# Patient Record
Sex: Male | Born: 2005 | Race: White | Hispanic: No | Marital: Single | Smoking: Never smoker
Health system: Southern US, Community
[De-identification: ages and names within clinical notes are randomized; demographics above are authoritative.]

---
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
## 2023-08-25 ENCOUNTER — Other Ambulatory Visit: Payer: Self-pay

## 2023-08-25 ENCOUNTER — Emergency Department: Payer: Medicaid Other

## 2023-08-25 ENCOUNTER — Encounter: Payer: Self-pay | Admitting: Emergency Medicine

## 2023-08-25 ENCOUNTER — Ambulatory Visit
Admission: EM | Admit: 2023-08-25 | Discharge: 2023-08-25 | Disposition: A | Discharge status: Home / Self Care | Payer: Medicaid Other | Attending: Surgery | Admitting: Surgery

## 2023-08-25 ENCOUNTER — Other Ambulatory Visit: Payer: Self-pay | Admitting: Surgery

## 2023-08-25 ENCOUNTER — Encounter
Admission: EM | Disposition: A | Discharge status: Home / Self Care | Payer: Self-pay | Source: Home / Self Care | Attending: Emergency Medicine

## 2023-08-25 DIAGNOSIS — K353 Acute appendicitis with localized peritonitis, without perforation or gangrene: Secondary | ICD-10-CM | POA: Diagnosis present

## 2023-08-25 DIAGNOSIS — K3580 Unspecified acute appendicitis: Secondary | ICD-10-CM | POA: Diagnosis not present

## 2023-08-25 HISTORY — PX: XI ROBOTIC LAPAROSCOPIC ASSISTED APPENDECTOMY: SHX6877

## 2023-08-25 LAB — COMPREHENSIVE METABOLIC PANEL
ALT: 27 U/L (ref 0–44)
AST: 19 U/L (ref 15–41)
Albumin: 4.5 g/dL (ref 3.5–5.0)
Alkaline Phosphatase: 67 U/L (ref 38–126)
Anion gap: 9 (ref 5–15)
BUN: 16 mg/dL (ref 6–20)
CO2: 23 mmol/L (ref 22–32)
Calcium: 9.1 mg/dL (ref 8.9–10.3)
Chloride: 104 mmol/L (ref 98–111)
Creatinine, Ser: 0.76 mg/dL (ref 0.61–1.24)
GFR, Estimated: >60 mL/min (ref >60)
Glucose, Bld: 97 mg/dL (ref 70–99)
Potassium: 3.6 mmol/L (ref 3.5–5.1)
Sodium: 136 mmol/L (ref 135–145)
Total Bilirubin: 1.4 mg/dL — ABNORMAL HIGH (ref 0.3–1.2)
Total Protein: 7.9 g/dL (ref 6.5–8.1)

## 2023-08-25 LAB — CBC WITH DIFFERENTIAL/PLATELET
Abs Immature Granulocytes: 0.04 10*3/uL (ref 0.00–0.07)
Basophils Absolute: 0.1 10*3/uL (ref 0.0–0.1)
Basophils Relative: 1 %
Eosinophils Absolute: 0.1 10*3/uL (ref 0.0–0.5)
Eosinophils Relative: 1 %
HCT: 40.5 % (ref 39.0–52.0)
Hemoglobin: 14 g/dL (ref 13.0–17.0)
Immature Granulocytes: 1 %
Lymphocytes Relative: 26 %
Lymphs Abs: 2.2 10*3/uL (ref 0.7–4.0)
MCH: 30 pg (ref 26.0–34.0)
MCHC: 34.6 g/dL (ref 30.0–36.0)
MCV: 86.9 fL (ref 80.0–100.0)
Monocytes Absolute: 0.7 10*3/uL (ref 0.1–1.0)
Monocytes Relative: 8 %
Neutro Abs: 5.4 10*3/uL (ref 1.7–7.7)
Neutrophils Relative %: 63 %
Platelets: 276 10*3/uL (ref 150–400)
RBC: 4.66 MIL/uL (ref 4.22–5.81)
RDW: 12.8 % (ref 11.5–15.5)
WBC: 8.4 10*3/uL (ref 4.0–10.5)
nRBC: 0 % (ref 0.0–0.2)

## 2023-08-25 LAB — LIPASE, BLOOD: Lipase: 27 U/L (ref 11–51)

## 2023-08-25 SURGERY — APPENDECTOMY, ROBOT-ASSISTED, LAPAROSCOPIC
Anesthesia: General

## 2023-08-25 MED ORDER — LACTATED RINGERS IV SOLN: INTRAVENOUS | Status: DC | PRN

## 2023-08-25 MED ORDER — PIPERACILLIN-TAZOBACTAM 3.375 G IVPB
INTRAVENOUS | Status: AC
  Filled 2023-08-25: qty 50

## 2023-08-25 MED ORDER — SUGAMMADEX SODIUM 200 MG/2ML IV SOLN
INTRAVENOUS | Status: DC | PRN
  Administered 2023-08-25: 300 mg via INTRAVENOUS

## 2023-08-25 MED ORDER — ONDANSETRON HCL 4 MG/2ML IJ SOLN
INTRAMUSCULAR | Status: DC | PRN
Start: 2023-08-25 — End: 2023-08-25
  Administered 2023-08-25: 4 mg via INTRAVENOUS

## 2023-08-25 MED ORDER — FENTANYL CITRATE (PF) 100 MCG/2ML IJ SOLN
INTRAMUSCULAR | Status: AC
  Filled 2023-08-25: qty 2

## 2023-08-25 MED ORDER — PROPOFOL 10 MG/ML IV BOLUS
INTRAVENOUS | Status: AC
  Filled 2023-08-25: qty 40

## 2023-08-25 MED ORDER — SODIUM CHLORIDE 0.9 % IV SOLN: INTRAVENOUS | Status: DC

## 2023-08-25 MED ORDER — ACETAMINOPHEN 10 MG/ML IV SOLN
INTRAVENOUS | Status: DC | PRN
  Administered 2023-08-25: 1000 mg via INTRAVENOUS

## 2023-08-25 MED ORDER — ONDANSETRON HCL 4 MG/2ML IJ SOLN: 4.0000 mg | Freq: Four times a day (QID) | INTRAMUSCULAR | Status: DC | PRN

## 2023-08-25 MED ORDER — FENTANYL CITRATE (PF) 100 MCG/2ML IJ SOLN
INTRAMUSCULAR | Status: DC | PRN
  Administered 2023-08-25: 50 ug via INTRAVENOUS

## 2023-08-25 MED ORDER — ROCURONIUM BROMIDE 100 MG/10ML IV SOLN
INTRAVENOUS | Status: DC | PRN
  Administered 2023-08-25: 50 mg via INTRAVENOUS
  Administered 2023-08-25: 20 mg via INTRAVENOUS

## 2023-08-25 MED ORDER — OXYCODONE HCL 5 MG PO TABS
ORAL_TABLET | ORAL | Status: AC
  Filled 2023-08-25: qty 1

## 2023-08-25 MED ORDER — IOHEXOL 350 MG/ML SOLN
100.0000 mL | Freq: Once | INTRAVENOUS | Status: AC | PRN
  Administered 2023-08-25: 100 mL via INTRAVENOUS

## 2023-08-25 MED ORDER — BUPIVACAINE-EPINEPHRINE (PF) 0.25% -1:200000 IJ SOLN
INTRAMUSCULAR | Status: DC | PRN
  Administered 2023-08-25: 10 mL
  Administered 2023-08-25: 20 mL

## 2023-08-25 MED ORDER — DEXMEDETOMIDINE HCL IN NACL 80 MCG/20ML IV SOLN
INTRAVENOUS | Status: DC | PRN
Start: 2023-08-25 — End: 2023-08-25
  Administered 2023-08-25: 20 ug via INTRAVENOUS

## 2023-08-25 MED ORDER — SODIUM CHLORIDE 0.9 % IV SOLN: Freq: Once | INTRAVENOUS | Status: AC

## 2023-08-25 MED ORDER — MIDAZOLAM HCL 2 MG/2ML IJ SOLN
INTRAMUSCULAR | Status: AC
  Filled 2023-08-25: qty 2

## 2023-08-25 MED ORDER — PIPERACILLIN-TAZOBACTAM 3.375 G IVPB 30 MIN
3.3750 g | Freq: Once | INTRAVENOUS | Status: AC
  Administered 2023-08-25: 3.375 g via INTRAVENOUS
  Filled 2023-08-25: qty 50

## 2023-08-25 MED ORDER — DEXAMETHASONE SODIUM PHOSPHATE 10 MG/ML IJ SOLN
INTRAMUSCULAR | Status: DC | PRN
  Administered 2023-08-25: 10 mg via INTRAVENOUS

## 2023-08-25 MED ORDER — PROPOFOL 10 MG/ML IV BOLUS
INTRAVENOUS | Status: DC | PRN
Start: 2023-08-25 — End: 2023-08-25
  Administered 2023-08-25: 50 ug/kg/min via INTRAVENOUS
  Administered 2023-08-25: 250 mg via INTRAVENOUS
  Administered 2023-08-25: 50 mg via INTRAVENOUS

## 2023-08-25 MED ORDER — IBUPROFEN 800 MG PO TABS: 800.0000 mg | ORAL_TABLET | Freq: Three times a day (TID) | ORAL | 0 refills | Status: AC | PRN

## 2023-08-25 MED ORDER — HYDROCODONE-ACETAMINOPHEN 5-325 MG PO TABS: 1.0000 | ORAL_TABLET | Freq: Four times a day (QID) | ORAL | 0 refills | Status: DC | PRN

## 2023-08-25 MED ORDER — FENTANYL CITRATE (PF) 100 MCG/2ML IJ SOLN: 25.0000 ug | INTRAMUSCULAR | Status: DC | PRN

## 2023-08-25 MED ORDER — MORPHINE SULFATE (PF) 4 MG/ML IV SOLN: 4.0000 mg | INTRAVENOUS | Status: DC | PRN

## 2023-08-25 MED ORDER — PIPERACILLIN-TAZOBACTAM 3.375 G IVPB 30 MIN
3.3750 g | Freq: Once | INTRAVENOUS | Status: AC
  Administered 2023-08-25: 3.375 g via INTRAVENOUS

## 2023-08-25 MED ORDER — GLYCOPYRROLATE 0.2 MG/ML IJ SOLN
INTRAMUSCULAR | Status: DC | PRN
  Administered 2023-08-25: .2 mg via INTRAVENOUS

## 2023-08-25 MED ORDER — LIDOCAINE HCL (CARDIAC) PF 100 MG/5ML IV SOSY
PREFILLED_SYRINGE | INTRAVENOUS | Status: DC | PRN
  Administered 2023-08-25: 100 mg via INTRAVENOUS

## 2023-08-25 MED ORDER — MIDAZOLAM HCL 2 MG/2ML IJ SOLN
INTRAMUSCULAR | Status: DC | PRN
  Administered 2023-08-25: 2 mg via INTRAVENOUS

## 2023-08-25 MED ORDER — DROPERIDOL 2.5 MG/ML IJ SOLN: 0.6250 mg | Freq: Once | INTRAMUSCULAR | Status: DC | PRN

## 2023-08-25 MED ORDER — ACETAMINOPHEN 10 MG/ML IV SOLN
INTRAVENOUS | Status: AC
  Filled 2023-08-25: qty 100

## 2023-08-25 MED ORDER — LIDOCAINE HCL (PF) 2 % IJ SOLN
INTRAMUSCULAR | Status: AC
  Filled 2023-08-25: qty 5

## 2023-08-25 MED ORDER — HYDROCODONE-ACETAMINOPHEN 5-300 MG PO TABS: 1.0000 | ORAL_TABLET | Freq: Four times a day (QID) | ORAL | 0 refills | Status: DC | PRN

## 2023-08-25 MED ORDER — BUPIVACAINE-EPINEPHRINE (PF) 0.25% -1:200000 IJ SOLN
INTRAMUSCULAR | Status: AC
  Filled 2023-08-25: qty 30

## 2023-08-25 MED ORDER — OXYCODONE HCL 5 MG PO TABS
5.0000 mg | ORAL_TABLET | Freq: Once | ORAL | Status: AC
  Administered 2023-08-25: 5 mg via ORAL

## 2023-08-25 SURGICAL SUPPLY — 57 items
ADH SKN CLS APL DERMABOND .7 (GAUZE/BANDAGES/DRESSINGS) ×1
BAG PRESSURE INF REUSE 3000 (BAG) IMPLANT
BLADE CLIPPER SURG (BLADE) IMPLANT
CANNULA REDUCER 12-8 DVNC XI (CANNULA) IMPLANT
COVER TIP SHEARS 8 DVNC (MISCELLANEOUS) ×1 IMPLANT
CUTTER FLEX LINEAR 45M (STAPLE) IMPLANT
DERMABOND ADVANCED .7 DNX12 (GAUZE/BANDAGES/DRESSINGS) ×1 IMPLANT
DRAPE ARM DVNC X/XI (DISPOSABLE) ×3 IMPLANT
DRAPE COLUMN DVNC XI (DISPOSABLE) ×1 IMPLANT
FORCEPS BPLR R/ABLATION 8 DVNC (INSTRUMENTS) ×1 IMPLANT
GLOVE ORTHO TXT STRL SZ7.5 (GLOVE) ×2 IMPLANT
GOWN STRL REUS W/ TWL LRG LVL3 (GOWN DISPOSABLE) ×1 IMPLANT
GOWN STRL REUS W/ TWL XL LVL3 (GOWN DISPOSABLE) ×2 IMPLANT
GOWN STRL REUS W/TWL LRG LVL3 (GOWN DISPOSABLE) ×1
GOWN STRL REUS W/TWL XL LVL3 (GOWN DISPOSABLE) ×1
GRASPER SUT TROCAR 14GX15 (MISCELLANEOUS) IMPLANT
GRASPER TIP-UP FEN DVNC XI (INSTRUMENTS) ×1 IMPLANT
IRRIGATION STRYKERFLOW (MISCELLANEOUS) IMPLANT
IRRIGATOR STRYKERFLOW (MISCELLANEOUS)
IRRIGATOR SUCT 8 DISP DVNC XI (IRRIGATION / IRRIGATOR) IMPLANT
IV NS IRRIG 3000ML ARTHROMATIC (IV SOLUTION) IMPLANT
KIT PINK PAD W/HEAD ARE REST (MISCELLANEOUS) ×1 IMPLANT
KIT PINK PAD W/HEAD ARM REST (MISCELLANEOUS) ×1 IMPLANT
KIT TURNOVER KIT A (KITS) ×1 IMPLANT
LABEL OR SOLS (LABEL) ×1 IMPLANT
MANIFOLD NEPTUNE II (INSTRUMENTS) ×1 IMPLANT
NDL DRIVE SUT CUT DVNC (INSTRUMENTS) ×1 IMPLANT
NDL HYPO 22X1.5 SAFETY MO (MISCELLANEOUS) ×1 IMPLANT
NDL INSUFFLATION 14GA 120MM (NEEDLE) IMPLANT
NEEDLE DRIVE SUT CUT DVNC (INSTRUMENTS) ×1 IMPLANT
NEEDLE HYPO 22X1.5 SAFETY MO (MISCELLANEOUS) ×1 IMPLANT
NEEDLE INSUFFLATION 14GA 120MM (NEEDLE) ×1 IMPLANT
NS IRRIG 500ML POUR BTL (IV SOLUTION) ×1 IMPLANT
PACK LAP CHOLECYSTECTOMY (MISCELLANEOUS) ×1 IMPLANT
RELOAD 45 VASCULAR/THIN (ENDOMECHANICALS) IMPLANT
RELOAD STAPLE 45 2.5 WHT DVNC (STAPLE) IMPLANT
RELOAD STAPLE 45 2.5 WHT GRN (ENDOMECHANICALS) IMPLANT
RELOAD STAPLER 2.5X45 WHT DVNC (STAPLE) IMPLANT
SCISSORS MNPLR CVD DVNC XI (INSTRUMENTS) ×1 IMPLANT
SEAL UNIV 5-12 XI (MISCELLANEOUS) ×3 IMPLANT
SET TUBE SMOKE EVAC HIGH FLOW (TUBING) ×1 IMPLANT
SPIKE FLUID TRANSFER (MISCELLANEOUS) ×1 IMPLANT
STAPLER 45 SUREFORM DVNC (STAPLE) IMPLANT
STAPLER RELOAD 2.5X45 WHT DVNC (STAPLE)
SUT MNCRL 4-0 (SUTURE) ×1
SUT MNCRL 4-0 27XMFL (SUTURE) ×1
SUT SILK 2 0 SH (SUTURE) IMPLANT
SUT VIC AB 2-0 SH 27 (SUTURE) ×1
SUT VIC AB 2-0 SH 27XBRD (SUTURE) ×1 IMPLANT
SUT VICRYL 0 UR6 27IN ABS (SUTURE) ×1 IMPLANT
SUTURE MNCRL 4-0 27XMF (SUTURE) ×1 IMPLANT
SYS BAG RETRIEVAL 10MM (BASKET) ×1
SYSTEM BAG RETRIEVAL 10MM (BASKET) ×1 IMPLANT
TRAP FLUID SMOKE EVACUATOR (MISCELLANEOUS) ×1 IMPLANT
TRAY FOLEY SLVR 16FR LF STAT (SET/KITS/TRAYS/PACK) ×1 IMPLANT
TROCAR Z-THREAD FIOS 12X100MM (TROCAR) ×1 IMPLANT
WATER STERILE IRR 500ML POUR (IV SOLUTION) ×1 IMPLANT

## 2023-08-25 NOTE — ED Notes (Signed)
Patient transported to CT 

## 2023-08-25 NOTE — Op Note (Signed)
Robotic appendectomy  Pre-operative Diagnosis: Acute appendicitis  Post-operative Diagnosis: same.    Surgeon: [name], M.D., FACS  Anesthesia: GETA   Findings: Exudative changes with omental adherence.  No gangrene, no perforation.   Estimated Blood Loss: 5 mL         Specimens:  Appendix           Complications: none              Procedure Details  The patient was seen again in the Holding Room. The benefits, complications, treatment options, and expected outcomes were discussed with the patient. The risks of bleeding, infection, recurrence of symptoms, failure to resolve symptoms, unanticipated injury, prosthetic placement, prosthetic infection, any of which could require further surgery were reviewed with the patient. The likelihood of improving the patient's symptoms with return to their baseline status is anticipated.  The patient and/or family concurred with the proposed plan, giving informed consent.  The patient was taken to Operating Room, identified and the procedure verified.    Prior to the induction of general anesthesia, antibiotic prophylaxis was administered. VTE prophylaxis was in place. GETA was then administered and tolerated well. After the induction, the patient was positioned in the supine position and the abdomen was prepped with Chloraprep and draped in the sterile fashion.  A Time Out was held and the above information confirmed.  After local infiltration of quarter percent Marcaine with epinephrine,just below the costal margin approximately midclavicular line the Veress needle is passed with sensation of the layers to penetrate the abdominal wall and into the peritoneum.  Saline drop test is confirmed peritoneal placement.  Insufflation is initiated with carbon dioxide to pressures of 15 mmHg.  With local anesthetic infiltration, a left lower quadrant incision is made, and an optical 12 mm trocar is passed into the peritoneal cavity under direct  visualization.  Additional 8.5 mm robotic trochars placed in the left abdominal wall under direct visualization.  Local infiltration with 0.25% Marcaine with epinephrine is utilized for all port sites with deep infiltration under visualization.   Using a force bipolar graspers with monopolar scissors I proceeded with dissecting out the soft tissues adjacent to the cecum and appendix to fully identify the appendix, and mobilize it to the cecal junction.  The mesoappendix was carefully divided utilizing bipolar cautery, monopolar cautery and scissors. With the appendiceal cecal junction fully isolated, the appendiceal base was crimped, a double ligature of 2-0 silk was utilized to ligate the base of the appendix, and the appendix was divided with scissors, the appendiceal mucosa was then fulgurated with the same. I utilized a 2-0 Silk to dunk the appendiceal stump into the cecal serosa, burying the stump with a pursestring of suture. We then placed the appendix in a retrieval bag and withdrew it out the largest port site. We then undocked the robot and proceeded with completing the procedure laparoscopically. We closed the largest port site utilizing PMI and cone with a 0 Vicryl under direct visualization.  The abdomen was then desufflated and the trochars removed. Incisions were then irrigated and closed with subcuticulars of 4-0 Monocryl.  Skin sealed with Dermabond.  Patient tolerated procedure well.    [name] M.D., [facility] 08/25/2023 3:03 PM

## 2023-08-25 NOTE — H&P (Signed)
[facility] SURGICAL HISTORY & PHYSICAL (cpt [phone])  HISTORY OF PRESENT ILLNESS (HPI):  17 y.o. male presented to [facility] ED overnight for abdominal pain. Patient reports RLQ abdominal pain over the course of the last 24 hours or so. This has remained fairly constant with slight worsening. No associated fever, chills, nausea, emesis, or bowel changes. No previous abdominal surgeries. No history of similar in the past. Work up in the ED revealed normal WBC at 8.4K, sCr 0.476, no electrolyte derangements. CT Abdomen/Pelvis was obtained and concerning for acute uncomplicated appendicitis.   General Surgery is consulted by emergency medicine physician Dr Loleta Rose, MD for evaluation and management of acute appendicitis   PAST MEDICAL HISTORY (PMH):  History reviewed. No pertinent past medical history.  Reviewed. Otherwise negative.   PAST SURGICAL HISTORY (PSH):  History reviewed. No pertinent surgical history.  Reviewed. Otherwise negative.   MEDICATIONS:  Prior to Admission medications   Not on File     ALLERGIES:  No Known Allergies   SOCIAL HISTORY:  Social History   Socioeconomic History   Marital status: Single    Spouse name: Not on file   Number of children: Not on file   Years of education: Not on file   Highest education level: Not on file  Occupational History   Not on file  Tobacco Use   Smoking status: Never    Passive exposure: Never   Smokeless tobacco: Never  Vaping Use   Vaping status: Never Used  Substance and Sexual Activity   Alcohol use: Never   Drug use: Never   Sexual activity: Never  Other Topics Concern   Not on file  Social History Narrative   Not on file   Social Determinants of Health   Financial Resource Strain: Not on file  Food Insecurity: Not on file  Transportation Needs: Not on file  Physical Activity: Not on file  Stress: Not on file  Social Connections: Not on file  Intimate Partner Violence: Not on file      FAMILY HISTORY:  History reviewed. No pertinent family history.  Otherwise negative.   REVIEW OF SYSTEMS:  Review of Systems  Constitutional:  Negative for chills and fever.  Respiratory:  Negative for cough and shortness of breath.   Cardiovascular:  Negative for chest pain and palpitations.  Gastrointestinal:  Positive for abdominal pain. Negative for constipation, diarrhea, nausea and vomiting.  Genitourinary:  Negative for dysuria and urgency.  All other systems reviewed and are negative.   VITAL SIGNS:  Temp:  [98.5 F (36.9 C)] 98.5 F (36.9 C) (08/28 0551) Pulse Rate:  [67] 67 (08/28 0551) Resp:  [17] 17 (08/28 0551) BP: (130)/(54) 130/54 (08/28 0551) SpO2:  [99 %] 99 % (08/28 0551) Weight:  [253 kg] 108 kg (08/28 0552)     Height: 5\' 9"  (175.3 cm) Weight: 108 kg BMI (Calculated): 35.13   PHYSICAL EXAM:  Physical Exam Vitals and nursing note reviewed. Exam conducted with a chaperone present.  Constitutional:      General: He is not in acute distress.    Appearance: He is well-developed. He is not ill-appearing.  HENT:     Head: Normocephalic and atraumatic.  Cardiovascular:     Rate and Rhythm: Normal rate and regular rhythm.     Heart sounds: Normal heart sounds.  Pulmonary:     Effort: Pulmonary effort is normal. No respiratory distress.  Abdominal:     General: Abdomen is flat. There is no distension.  Palpations: Abdomen is soft.     Tenderness: There is abdominal tenderness in the right lower quadrant. There is no guarding or rebound. Positive signs include McBurney's sign.     Comments: Abdomen is soft, localized RLQ tenderness, non-distended, no rebound/guarding   Genitourinary:    Comments: Deferred Skin:    General: Skin is warm and dry.     Coloration: Skin is not jaundiced.     Findings: No erythema.  Neurological:     General: No focal deficit present.     Mental Status: He is alert and oriented to person, place, and time.  Psychiatric:         Mood and Affect: Mood normal.        Behavior: Behavior normal.     INTAKE/OUTPUT:  This shift: No intake/output data recorded.  Last 2 shifts: @IOLAST2SHIFTS @  Labs:     Latest Ref Rng & Units 08/25/2023    6:03 AM 03/27/2022    8:59 PM  CBC  WBC 4.0 - 10.5 K/uL 8.4  8.5   Hemoglobin 13.0 - 17.0 g/dL 16.1  09.6   Hematocrit 39.0 - 52.0 % 40.5  41.3   Platelets 150 - 400 K/uL 276  287       Latest Ref Rng & Units 08/25/2023    6:03 AM 03/27/2022    8:59 PM  CMP  Glucose 70 - 99 mg/dL 97  99   BUN 6 - 20 mg/dL 16  12   Creatinine 0.45 - 1.24 mg/dL 4.09  8.11   Sodium 914 - 145 mmol/L 136  139   Potassium 3.5 - 5.1 mmol/L 3.6  3.7   Chloride 98 - 111 mmol/L 104  106   CO2 22 - 32 mmol/L 23  26   Calcium 8.9 - 10.3 mg/dL 9.1  9.4   Total Protein 6.5 - 8.1 g/dL 7.9  7.5   Total Bilirubin 0.3 - 1.2 mg/dL 1.4  0.9   Alkaline Phos 38 - 126 U/L 67  77   AST 15 - 41 U/L 19  36   ALT 0 - 44 U/L 27  43      Imaging studies:   CT Abdomen/Pelvis (08/25/2023) personally reviewed inflammation around distal appendix, no free air, and radiologist report reviewed below:  IMPRESSION: 1. Evidence of Acute Appendicitis, especially at the tip. Trace free fluid in the pelvis which is likely reactive. No evidence of perforation or abscess.   2. Otherwise negative CT Abdomen and Pelvis.   Assessment/Plan: (ICD-10's: K35.30) 17 y.o. male with acute uncomplicated appendicitis      - Plan for robotic assisted laparoscopic appendectomy with Dr Claudine Mouton pending OR/Anesthesia availability  - All risks, benefits, and alternatives to above procedure(s) were discussed with the patient and his family (mother at bedside), all of his questions were answered to his expressed satisfaction, patient expresses he wishes to proceed, and informed consent was obtained.    - NPO + IVF support  - IV Abx (Zosyn)  - Monitor abdominal examination  - Pain control prn; antiemetics prn   - Discharge  Planning: Plan for DC home post-operatively pending timing/clinical condition/operative findings, updated on instructions and follow up.  All of the above findings and recommendations were discussed with the patient and his family (mother at bedside), and all of their questions were answered to their expressed satisfaction.  -- Lynden Oxford, PA-C [facility] 08/25/2023, 7:32 AM M-F: 7am - 4pm

## 2023-08-25 NOTE — ED Provider Notes (Signed)
[facility] Provider Note    Event Date/Time   First MD Initiated Contact with Patient 08/25/23 [phone]     (approximate)   History   Abdominal Pain   HPI Bruce Carson is a 17 y.o. male with no chronic medical issues who presents for evaluation of right lower quadrant pain for a few days.  He said it has been about the same but maybe is getting a little bit worse.  No history of trauma.  A little bit worse when he moves around.  He denies fever, nausea, vomiting, diarrhea, any other abdominal pain, dysuria, pain in his scrotum.     Physical Exam   Triage Vital Signs: ED Triage Vitals  Encounter Vitals Group     BP 08/25/23 0551 (!) 130/54     Systolic BP Percentile --      Diastolic BP Percentile --      Pulse Rate 08/25/23 0551 67     Resp 08/25/23 0551 17     Temp 08/25/23 0551 98.5 F (36.9 C)     Temp src --      SpO2 08/25/23 0551 99 %     Weight 08/25/23 0552 108 kg (238 lb)     Height 08/25/23 0552 1.753 m (5\' 9" )     Head Circumference --      Peak Flow --      Pain Score 08/25/23 0552 8     Pain Loc --      Pain Education --      Exclude from Growth Chart --     Most recent vital signs: Vitals:   08/25/23 0551  BP: (!) 130/54  Pulse: 67  Resp: 17  Temp: 98.5 F (36.9 C)  SpO2: 99%    General: Awake, no distress.  CV:  Good peripheral perfusion.  Regular rate and rhythm Resp:  Normal effort. Speaking easily and comfortably, no accessory muscle usage nor intercostal retractions.   Abd:  No distention.  Tender to palpation of the right lower quadrant with some guarding.  No rebound.  No other tenderness to palpation.   ED Results / Procedures / Treatments   Labs (all labs ordered are listed, but only abnormal results are displayed) Labs Reviewed  COMPREHENSIVE METABOLIC PANEL - Abnormal; Notable for the following components:      Result Value   Total Bilirubin 1.4 (*)    All other components within normal limits  CBC WITH  DIFFERENTIAL/PLATELET  LIPASE, BLOOD      RADIOLOGY I viewed and interpreted the patient's CT of the abdomen and pelvis.  I did not immediately see   PROCEDURES:  Critical Care performed: No  Procedures    IMPRESSION / MDM / ASSESSMENT AND PLAN / ED COURSE  I reviewed the triage vital signs and the nursing notes.                              Differential diagnosis includes, but is not limited to, appendicitis, epiploic appendagitis, mesenteric adenitis, musculoskeletal strain, less likely biliary colic or testicular torsion.  Patient's presentation is most consistent with acute presentation with potential threat to life or bodily function.  Labs/studies ordered: CT abdomen/pelvis, CBC, lipase, CMP  Interventions/Medications given:  Medications  piperacillin-tazobactam (ZOSYN) IVPB 3.375 g (has no administration in time range)  morphine (PF) 4 MG/ML injection 4 mg (has no administration in time range)  ondansetron (ZOFRAN) injection 4 mg (has  no administration in time range)  0.9 %  sodium chloride infusion (has no administration in time range)  iohexol (OMNIPAQUE) 350 MG/ML injection 100 mL (100 mLs Intravenous Contrast Given 08/25/23 0616)    (Note:  hospital course my include additional interventions and/or labs/studies not listed above.)   Patient has relatively minimal pain and tenderness that has been going on for a few days but he is guarding with possibly some localized peritonitis in the right lower quadrant.  He is the appropriate demographic for appendicitis.  Vital signs are all stable and within normal limits.  Labs pending but no concern about his renal function.  We will proceed with CT abdomen/pelvis to evaluate for and hopefully rule out appendicitis.  Patient is not in need of analgesia nor antiemetics at this time.   Clinical Course as of 08/25/23 0743  Wed Aug 25, 2023  0102 Lab work is all within normal limits other than a very minimally elevated  total bilirubin which is unlikely to be clinically significant [CF]  0651 CT ABDOMEN PELVIS W CONTRAST I viewed and interpreted the patient's CT abdomen/pelvis.  I did not immediately identify an acute abnormality, but the radiologist feels that the appendiceal tip is infected and consistent with acute uncomplicated appendicitis.  I will consult surgery for further evaluation/management. [CF]  [id number] I consulted Dr. Claudine Mouton with general surgery.  He will admit the patient.  He requested I order antibiotics ordered Zosyn 3.375 g IV and previously reminded the patient to remain NPO.  He has been n.p.o. since 10 PM. [CF]    Clinical Course User Index [CF] Loleta Rose, MD     FINAL CLINICAL IMPRESSION(S) / ED DIAGNOSES   Final diagnoses:  Acute appendicitis with localized peritonitis, without perforation, abscess, or gangrene     Rx / DC Orders   ED Discharge Orders     None        Note:  This document was prepared using Dragon voice recognition software and may include unintentional dictation errors.   Loleta Rose, MD 08/25/23 [phone]

## 2023-08-25 NOTE — Discharge Instructions (Addendum)
In addition to included general post-operative instructions,  Diet: Resume home diet.   Activity: No heavy lifting >20 pounds (children, pets, laundry, garbage) or strenuous activity for 4 weeks, but light activity and walking are encouraged. Do not drive or drink alcohol if taking narcotic pain medications or having pain that might distract from driving.  Wound care: 2 days after surgery (08/30), you may shower/get incision wet with soapy water and pat dry (do not rub incisions), but no baths or submerging incision underwater until follow-up.   Medications: Resume all home medications. For mild to moderate pain: acetaminophen (Tylenol) or ibuprofen/naproxen (if no kidney disease). Combining Tylenol with alcohol can substantially increase your risk of causing liver disease. Narcotic pain medications, if prescribed, can be used for severe pain, though may cause nausea, constipation, and drowsiness. Do not combine Tylenol and Percocet (or similar) within a 6 hour period as Percocet (and similar) contain(s) Tylenol. If you do not need the narcotic pain medication, you do not need to fill the prescription.  Call office [phone] / [phone]) at any time if any questions, worsening pain, fevers/chills, bleeding, drainage from incision site, or other concerns.    AMBULATORY SURGERY  DISCHARGE INSTRUCTIONS   The drugs that you were given will stay in your system until tomorrow so for the next 24 hours you should not:  Drive an automobile Make any legal decisions Drink any alcoholic beverage   You may resume regular meals tomorrow.  Today it is better to start with liquids and gradually work up to solid foods.  You may eat anything you prefer, but it is better to start with liquids, then soup and crackers, and gradually work up to solid foods.   Please notify your doctor immediately if you have any unusual bleeding, trouble breathing, redness and pain at the surgery site, drainage,  fever, or pain not relieved by medication.    Additional Instructions:        Please contact your physician with any problems or Same Day Surgery at [phone], Monday through Friday 6 am to 4 pm, or  at [facility] number at [phone].

## 2023-08-25 NOTE — Anesthesia Procedure Notes (Signed)
Procedure Name: Intubation Date/Time: 08/25/2023 1:43 PM  Performed by: Lysbeth Penner, CRNAPre-anesthesia Checklist: Patient identified, Emergency Drugs available, Suction available and Patient being monitored Patient Re-evaluated:Patient Re-evaluated prior to induction Oxygen Delivery Method: Circle system utilized Preoxygenation: Pre-oxygenation with 100% oxygen Induction Type: IV induction Ventilation: Mask ventilation without difficulty Laryngoscope Size: McGraph and 4 Tube type: Oral Tube size: 7.0 mm Number of attempts: 1 Airway Equipment and Method: Stylet and Oral airway Placement Confirmation: ETT inserted through vocal cords under direct vision, positive ETCO2 and breath sounds checked- equal and bilateral Secured at: 24 cm Tube secured with: Tape Dental Injury: Teeth and Oropharynx as per pre-operative assessment

## 2023-08-25 NOTE — Anesthesia Preprocedure Evaluation (Signed)
Anesthesia Evaluation  Patient identified by MRN, date of birth, ID band Patient awake    Reviewed: Allergy & Precautions, H&P , NPO status , Patient's Chart, lab work & pertinent test results, reviewed documented beta blocker date and time   History of Anesthesia Complications Negative for: history of anesthetic complications  Airway Mallampati: III  TM Distance: >3 FB Neck ROM: full    Dental  (+) Dental Advidsory Given, Teeth Intact   Pulmonary neg pulmonary ROS, Continuous Positive Airway Pressure Ventilation    Pulmonary exam normal breath sounds clear to auscultation       Cardiovascular Exercise Tolerance: Good negative cardio ROS Normal cardiovascular exam Rhythm:regular Rate:Normal     Neuro/Psych negative neurological ROS  negative psych ROS   GI/Hepatic negative GI ROS, Neg liver ROS,,,  Endo/Other  negative endocrine ROS    Renal/GU negative Renal ROS  negative genitourinary   Musculoskeletal   Abdominal   Peds  Hematology negative hematology ROS (+)   Anesthesia Other Findings History reviewed. No pertinent past medical history.   Reproductive/Obstetrics negative OB ROS                             Anesthesia Physical Anesthesia Plan  ASA: 1  Anesthesia Plan: General   Post-op Pain Management:    Induction: Intravenous  PONV Risk Score and Plan: 2 and Ondansetron, Dexamethasone, Midazolam and Treatment may vary due to age or medical condition  Airway Management Planned: Oral ETT  Additional Equipment:   Intra-op Plan:   Post-operative Plan: Extubation in OR  Informed Consent: I have reviewed the patients History and Physical, chart, labs and discussed the procedure including the risks, benefits and alternatives for the proposed anesthesia with the patient or authorized representative who has indicated his/her understanding and acceptance.     Dental Advisory  Given  Plan Discussed with: Anesthesiologist, CRNA and Surgeon  Anesthesia Plan Comments:        Anesthesia Quick Evaluation

## 2023-08-25 NOTE — Transfer of Care (Signed)
Immediate Anesthesia Transfer of Care Note  Patient: Bruce Carson  Procedure(s) Performed: XI ROBOTIC LAPAROSCOPIC ASSISTED APPENDECTOMY  Patient Location: PACU  Anesthesia Type:General  Level of Consciousness: drowsy and responds to stimulation  Airway & Oxygen Therapy: Patient Spontanous Breathing and Patient connected to face mask oxygen  Post-op Assessment: Report given to RN and Post -op Vital signs reviewed and stable  Post vital signs: Reviewed and stable  Last Vitals:  Vitals Value Taken Time  BP 104/32 08/25/23 1502  Temp 36.3 C 08/25/23 1502  Pulse 82 08/25/23 1506  Resp 31 08/25/23 1506  SpO2 98 % 08/25/23 1506  Vitals shown include unfiled device data.  Last Pain:  Vitals:   08/25/23 1203  TempSrc: Temporal  PainSc: 0-No pain         Complications: There were no known notable events for this encounter.

## 2023-08-25 NOTE — ED Notes (Signed)
Assumed care of pt at this time. Pt is AAXO4, c/o RLQ pain, tenderness upon palpation. Denies Urinary sx's. BM PTA and normal. No needs identified at this time.

## 2023-08-25 NOTE — Progress Notes (Signed)
Rx for post-op Pain, anticipating discharge post-op

## 2023-08-25 NOTE — Interval H&P Note (Signed)
History and Physical Interval Note:  08/25/2023 12:56 PM  Bruce Carson  has presented today for surgery, with the diagnosis of acute appendicitis.  The various methods of treatment have been discussed with the patient and family. After consideration of risks, benefits and other options for treatment, the patient has consented to  Procedure(s): XI ROBOTIC LAPAROSCOPIC ASSISTED APPENDECTOMY (N/A) as a surgical intervention.  The patient's history has been reviewed, patient examined, no change in status, stable for surgery.  I have reviewed the patient's chart and labs.  Questions were answered to the patient's satisfaction.     [name]

## 2023-08-25 NOTE — ED Triage Notes (Addendum)
Patient ambulatory to triage with steady gait, without difficulty or distress noted; pt reports rt lower abd pain with no accomp symptoms for several days

## 2023-08-26 ENCOUNTER — Encounter: Payer: Self-pay | Admitting: Surgery

## 2023-09-02 NOTE — Anesthesia Postprocedure Evaluation (Signed)
Anesthesia Post Note  Patient: Bruce Carson  Procedure(s) Performed: XI ROBOTIC LAPAROSCOPIC ASSISTED APPENDECTOMY  Patient location during evaluation: PACU Anesthesia Type: General Level of consciousness: awake and alert Pain management: pain level controlled Vital Signs Assessment: post-procedure vital signs reviewed and stable Respiratory status: spontaneous breathing, nonlabored ventilation, respiratory function stable and patient connected to nasal cannula oxygen Cardiovascular status: blood pressure returned to baseline and stable Postop Assessment: no apparent nausea or vomiting Anesthetic complications: no   There were no known notable events for this encounter.   Last Vitals:  Vitals:   08/25/23 1550 08/25/23 1620  BP:  137/60  Pulse: 79 (!) 55  Resp: (!) 26 20  Temp:  36.4 C  SpO2: 98% 99%    Last Pain:  Vitals:   08/25/23 1620  TempSrc: Temporal  PainSc: 4                  Lenard Simmer

## 2023-09-09 ENCOUNTER — Ambulatory Visit: Payer: Medicaid Other | Admitting: Physician Assistant

## 2023-09-09 ENCOUNTER — Encounter: Payer: Self-pay | Admitting: Physician Assistant

## 2023-09-09 VITALS — BP 130/76 | HR 61 | Temp 98.2°F | Ht 69.0 in | Wt 236.0 lb

## 2023-09-09 DIAGNOSIS — K353 Acute appendicitis with localized peritonitis, without perforation or gangrene: Secondary | ICD-10-CM

## 2023-09-09 DIAGNOSIS — K3580 Unspecified acute appendicitis: Secondary | ICD-10-CM

## 2023-09-09 DIAGNOSIS — Z09 Encounter for follow-up examination after completed treatment for conditions other than malignant neoplasm: Secondary | ICD-10-CM

## 2023-09-09 NOTE — Patient Instructions (Signed)

## 2023-09-09 NOTE — Progress Notes (Signed)
[facility] POST-OP OFFICE VISIT  09/09/2023  HPI: Bruce Carson is a 17 y.o. male 15 days s/p robotic assisted laparoscopic appendectomy for acute appendicitis with Dr Claudine Mouton  He is doing well  Had pain for about the first 3 days Now pain free No fever, chills, nausea, emesis, or bowel changes Incisions are well healed  Vital signs: BP 130/76   Pulse 61   Temp 98.2 F (36.8 C)   Ht 5\' 9"  (1.753 m)   Wt 236 lb (107 kg)   SpO2 98%   BMI 34.85 kg/m    Physical Exam: Constitutional: Well appearing male, NAD Abdomen: Soft, non-tender, non-distended, no rebound/guarding Skin: Laparoscopic incisions are healing well, no erythema or drainage   Assessment/Plan: This is a 17 y.o. male 15 days s/p robotic assisted laparoscopic appendectomy for acute appendicitis with Dr Claudine Mouton   - Pain control prn  - Reviewed wound care recommendation  - Reviewed lifting restrictions; 4 weeks total  - Reviewed surgical pathology; Appendicitis  - He can follow up on as needed basis; He understands to call with questions/concerns  -- Lynden Oxford, PA-C [facility] 09/09/2023, 2:39 PM M-F: 7am - 4pm
